# Patient Record
Sex: Female | Born: 1965 | Race: White | Hispanic: No | Smoking: Never smoker
Health system: Southern US, Community
[De-identification: ages and names within clinical notes are randomized; demographics above are authoritative.]

## PROBLEM LIST (undated history)

## (undated) DIAGNOSIS — E785 Hyperlipidemia, unspecified: Secondary | ICD-10-CM

## (undated) DIAGNOSIS — Z803 Family history of malignant neoplasm of breast: Secondary | ICD-10-CM

## (undated) DIAGNOSIS — N879 Dysplasia of cervix uteri, unspecified: Secondary | ICD-10-CM

## (undated) DIAGNOSIS — J329 Chronic sinusitis, unspecified: Secondary | ICD-10-CM

## (undated) HISTORY — DX: Family history of malignant neoplasm of breast: Z80.3

## (undated) HISTORY — DX: Hyperlipidemia, unspecified: E78.5

## (undated) HISTORY — PX: CERVIX LESION DESTRUCTION: SHX591

## (undated) HISTORY — DX: Chronic sinusitis, unspecified: J32.9

## (undated) HISTORY — DX: Dysplasia of cervix uteri, unspecified: N87.9

---
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
## 1999-11-07 HISTORY — PX: AUGMENTATION MAMMAPLASTY: SUR837

## 1999-11-07 HISTORY — PX: BREAST SURGERY: SHX581

## 2014-07-02 VITALS — BP 108/72 | HR 61 | Temp 97.9°F | Resp 16 | Ht 63.75 in | Wt 103.5 lb

## 2014-07-02 DIAGNOSIS — Z8249 Family history of ischemic heart disease and other diseases of the circulatory system: Secondary | ICD-10-CM

## 2014-07-02 DIAGNOSIS — R634 Abnormal weight loss: Secondary | ICD-10-CM

## 2014-07-02 DIAGNOSIS — R636 Underweight: Secondary | ICD-10-CM

## 2014-07-02 DIAGNOSIS — E876 Hypokalemia: Secondary | ICD-10-CM

## 2014-07-02 DIAGNOSIS — Z1159 Encounter for screening for other viral diseases: Secondary | ICD-10-CM

## 2014-07-02 DIAGNOSIS — Z13220 Encounter for screening for lipoid disorders: Secondary | ICD-10-CM

## 2014-07-02 DIAGNOSIS — R630 Anorexia: Secondary | ICD-10-CM

## 2014-07-03 DIAGNOSIS — E876 Hypokalemia: Secondary | ICD-10-CM

## 2014-07-05 DIAGNOSIS — E876 Hypokalemia: Secondary | ICD-10-CM | POA: Insufficient documentation

## 2014-07-05 DIAGNOSIS — Z8249 Family history of ischemic heart disease and other diseases of the circulatory system: Secondary | ICD-10-CM | POA: Insufficient documentation

## 2014-07-05 DIAGNOSIS — Z13220 Encounter for screening for lipoid disorders: Secondary | ICD-10-CM | POA: Insufficient documentation

## 2014-07-05 DIAGNOSIS — R636 Underweight: Secondary | ICD-10-CM | POA: Insufficient documentation

## 2014-07-05 DIAGNOSIS — R634 Abnormal weight loss: Secondary | ICD-10-CM | POA: Insufficient documentation

## 2015-06-07 DIAGNOSIS — B353 Tinea pedis: Secondary | ICD-10-CM

## 2015-07-07 VITALS — BP 96/70 | HR 69 | Temp 97.9°F | Resp 12 | Ht 63.0 in | Wt 102.0 lb

## 2015-07-07 DIAGNOSIS — R634 Abnormal weight loss: Secondary | ICD-10-CM | POA: Diagnosis not present

## 2015-07-07 DIAGNOSIS — E785 Hyperlipidemia, unspecified: Secondary | ICD-10-CM | POA: Diagnosis not present

## 2015-07-07 DIAGNOSIS — E876 Hypokalemia: Secondary | ICD-10-CM

## 2015-07-07 DIAGNOSIS — E559 Vitamin D deficiency, unspecified: Secondary | ICD-10-CM | POA: Diagnosis not present

## 2015-07-07 DIAGNOSIS — R636 Underweight: Secondary | ICD-10-CM

## 2015-07-07 DIAGNOSIS — R599 Enlarged lymph nodes, unspecified: Secondary | ICD-10-CM | POA: Diagnosis not present

## 2015-07-07 DIAGNOSIS — R591 Generalized enlarged lymph nodes: Secondary | ICD-10-CM

## 2015-07-09 DIAGNOSIS — E876 Hypokalemia: Secondary | ICD-10-CM

## 2015-07-10 DIAGNOSIS — R591 Generalized enlarged lymph nodes: Secondary | ICD-10-CM | POA: Insufficient documentation

## 2015-07-26 DIAGNOSIS — B353 Tinea pedis: Secondary | ICD-10-CM

## 2015-08-12 VITALS — BP 98/60 | HR 62 | Temp 98.2°F | Resp 14 | Ht 63.0 in | Wt 107.4 lb

## 2015-08-12 DIAGNOSIS — B029 Zoster without complications: Secondary | ICD-10-CM | POA: Diagnosis not present

## 2015-08-16 VITALS — BP 102/78 | HR 60 | Temp 98.2°F | Resp 16 | Ht 63.0 in | Wt 106.6 lb

## 2015-08-16 DIAGNOSIS — L0100 Impetigo, unspecified: Secondary | ICD-10-CM | POA: Diagnosis not present

## 2015-08-16 MED ADMIN — Methylprednisolone Acetate Inj Susp 40 MG/ML: 40 mg | INTRAMUSCULAR | NDC 00009307301

## 2015-08-18 DIAGNOSIS — L0100 Impetigo, unspecified: Secondary | ICD-10-CM | POA: Insufficient documentation

## 2015-08-24 VITALS — BP 110/78 | HR 59 | Temp 98.0°F | Resp 16 | Ht 63.0 in | Wt 103.4 lb

## 2015-08-24 DIAGNOSIS — L0100 Impetigo, unspecified: Secondary | ICD-10-CM | POA: Diagnosis not present

## 2015-08-24 DIAGNOSIS — B029 Zoster without complications: Secondary | ICD-10-CM

## 2015-09-03 DIAGNOSIS — B373 Candidiasis of vulva and vagina: Secondary | ICD-10-CM | POA: Insufficient documentation

## 2015-09-03 DIAGNOSIS — B3731 Acute candidiasis of vulva and vagina: Secondary | ICD-10-CM | POA: Insufficient documentation

## 2016-03-25 MED ORDER — PREDNISONE 10 MG PO TABS
ORAL_TABLET | ORAL | Status: DC
Start: 2016-03-25 — End: 2016-11-23

## 2016-04-04 DIAGNOSIS — E876 Hypokalemia: Secondary | ICD-10-CM

## 2016-04-10 DIAGNOSIS — E876 Hypokalemia: Secondary | ICD-10-CM | POA: Diagnosis not present

## 2017-01-18 VITALS — BP 100/80 | HR 66 | Wt 106.0 lb

## 2017-01-18 DIAGNOSIS — Z01419 Encounter for gynecological examination (general) (routine) without abnormal findings: Secondary | ICD-10-CM

## 2017-12-11 DIAGNOSIS — Z13220 Encounter for screening for lipoid disorders: Secondary | ICD-10-CM

## 2017-12-11 DIAGNOSIS — R5383 Other fatigue: Secondary | ICD-10-CM

## 2017-12-31 VITALS — BP 98/68 | HR 59 | Temp 99.2°F | Ht 63.0 in | Wt 109.8 lb

## 2017-12-31 DIAGNOSIS — J322 Chronic ethmoidal sinusitis: Secondary | ICD-10-CM

## 2017-12-31 DIAGNOSIS — R51 Headache: Secondary | ICD-10-CM | POA: Diagnosis not present

## 2017-12-31 DIAGNOSIS — R519 Headache, unspecified: Secondary | ICD-10-CM

## 2018-01-07 DIAGNOSIS — J329 Chronic sinusitis, unspecified: Secondary | ICD-10-CM

## 2018-01-07 DIAGNOSIS — H9209 Otalgia, unspecified ear: Secondary | ICD-10-CM

## 2018-01-14 DIAGNOSIS — R634 Abnormal weight loss: Secondary | ICD-10-CM

## 2018-01-14 DIAGNOSIS — E876 Hypokalemia: Secondary | ICD-10-CM

## 2018-01-14 DIAGNOSIS — Z13220 Encounter for screening for lipoid disorders: Secondary | ICD-10-CM

## 2018-01-23 VITALS — BP 106/74 | HR 63 | Ht 63.0 in | Wt 108.0 lb

## 2018-01-23 DIAGNOSIS — Z1151 Encounter for screening for human papillomavirus (HPV): Secondary | ICD-10-CM

## 2018-01-23 DIAGNOSIS — Z1231 Encounter for screening mammogram for malignant neoplasm of breast: Secondary | ICD-10-CM

## 2018-01-23 DIAGNOSIS — Z803 Family history of malignant neoplasm of breast: Secondary | ICD-10-CM

## 2018-01-23 DIAGNOSIS — Z01419 Encounter for gynecological examination (general) (routine) without abnormal findings: Secondary | ICD-10-CM | POA: Diagnosis not present

## 2018-01-23 DIAGNOSIS — Z1239 Encounter for other screening for malignant neoplasm of breast: Secondary | ICD-10-CM

## 2018-01-23 DIAGNOSIS — Z124 Encounter for screening for malignant neoplasm of cervix: Secondary | ICD-10-CM

## 2020-02-05 DIAGNOSIS — E785 Hyperlipidemia, unspecified: Secondary | ICD-10-CM

## 2020-02-05 DIAGNOSIS — E876 Hypokalemia: Secondary | ICD-10-CM | POA: Diagnosis not present

## 2020-02-05 DIAGNOSIS — Z1231 Encounter for screening mammogram for malignant neoplasm of breast: Secondary | ICD-10-CM

## 2020-02-05 DIAGNOSIS — Z9882 Breast implant status: Secondary | ICD-10-CM

## 2020-02-05 DIAGNOSIS — R634 Abnormal weight loss: Secondary | ICD-10-CM

## 2020-02-05 DIAGNOSIS — R7401 Elevation of levels of liver transaminase levels: Secondary | ICD-10-CM

## 2020-02-05 DIAGNOSIS — D72819 Decreased white blood cell count, unspecified: Secondary | ICD-10-CM | POA: Diagnosis not present

## 2020-02-05 DIAGNOSIS — L0100 Impetigo, unspecified: Secondary | ICD-10-CM

## 2020-02-05 DIAGNOSIS — Z13220 Encounter for screening for lipoid disorders: Secondary | ICD-10-CM

## 2020-02-07 DIAGNOSIS — Z9882 Breast implant status: Secondary | ICD-10-CM | POA: Insufficient documentation

## 2020-02-13 DIAGNOSIS — Z1231 Encounter for screening mammogram for malignant neoplasm of breast: Secondary | ICD-10-CM | POA: Diagnosis not present

## 2020-02-16 DIAGNOSIS — N6489 Other specified disorders of breast: Secondary | ICD-10-CM

## 2020-02-16 DIAGNOSIS — R928 Other abnormal and inconclusive findings on diagnostic imaging of breast: Secondary | ICD-10-CM

## 2020-02-27 DIAGNOSIS — R928 Other abnormal and inconclusive findings on diagnostic imaging of breast: Secondary | ICD-10-CM

## 2020-02-27 DIAGNOSIS — N6489 Other specified disorders of breast: Secondary | ICD-10-CM

## 2020-03-12 DIAGNOSIS — Z1231 Encounter for screening mammogram for malignant neoplasm of breast: Secondary | ICD-10-CM | POA: Diagnosis not present

## 2020-03-12 DIAGNOSIS — Z01419 Encounter for gynecological examination (general) (routine) without abnormal findings: Secondary | ICD-10-CM

## 2021-01-10 DIAGNOSIS — Z114 Encounter for screening for human immunodeficiency virus [HIV]: Secondary | ICD-10-CM

## 2021-01-10 DIAGNOSIS — E785 Hyperlipidemia, unspecified: Secondary | ICD-10-CM

## 2021-01-10 DIAGNOSIS — E876 Hypokalemia: Secondary | ICD-10-CM

## 2021-01-10 DIAGNOSIS — E559 Vitamin D deficiency, unspecified: Secondary | ICD-10-CM

## 2021-01-10 DIAGNOSIS — R634 Abnormal weight loss: Secondary | ICD-10-CM

## 2021-01-31 DIAGNOSIS — R634 Abnormal weight loss: Secondary | ICD-10-CM

## 2021-01-31 DIAGNOSIS — Z114 Encounter for screening for human immunodeficiency virus [HIV]: Secondary | ICD-10-CM | POA: Diagnosis not present

## 2021-01-31 DIAGNOSIS — E559 Vitamin D deficiency, unspecified: Secondary | ICD-10-CM | POA: Diagnosis not present

## 2021-01-31 DIAGNOSIS — E785 Hyperlipidemia, unspecified: Secondary | ICD-10-CM

## 2021-01-31 DIAGNOSIS — E876 Hypokalemia: Secondary | ICD-10-CM | POA: Diagnosis not present

## 2021-02-02 DIAGNOSIS — Z8616 Personal history of COVID-19: Secondary | ICD-10-CM

## 2021-02-02 DIAGNOSIS — Z1231 Encounter for screening mammogram for malignant neoplasm of breast: Secondary | ICD-10-CM | POA: Diagnosis not present

## 2021-02-02 DIAGNOSIS — Z0000 Encounter for general adult medical examination without abnormal findings: Secondary | ICD-10-CM | POA: Diagnosis not present

## 2021-02-05 DIAGNOSIS — Z0001 Encounter for general adult medical examination with abnormal findings: Secondary | ICD-10-CM | POA: Insufficient documentation

## 2021-02-05 DIAGNOSIS — Z0000 Encounter for general adult medical examination without abnormal findings: Secondary | ICD-10-CM | POA: Insufficient documentation

## 2021-03-03 DIAGNOSIS — Z1231 Encounter for screening mammogram for malignant neoplasm of breast: Secondary | ICD-10-CM | POA: Insufficient documentation

## 2021-03-28 DIAGNOSIS — Z01419 Encounter for gynecological examination (general) (routine) without abnormal findings: Secondary | ICD-10-CM | POA: Diagnosis not present

## 2021-03-28 DIAGNOSIS — Z1231 Encounter for screening mammogram for malignant neoplasm of breast: Secondary | ICD-10-CM | POA: Diagnosis not present

## 2022-03-06 DIAGNOSIS — D2271 Melanocytic nevi of right lower limb, including hip: Secondary | ICD-10-CM | POA: Diagnosis not present

## 2022-03-06 DIAGNOSIS — D225 Melanocytic nevi of trunk: Secondary | ICD-10-CM | POA: Diagnosis not present

## 2022-03-06 DIAGNOSIS — L570 Actinic keratosis: Secondary | ICD-10-CM | POA: Diagnosis not present

## 2022-03-06 DIAGNOSIS — D2261 Melanocytic nevi of right upper limb, including shoulder: Secondary | ICD-10-CM | POA: Diagnosis not present

## 2022-03-06 DIAGNOSIS — B078 Other viral warts: Secondary | ICD-10-CM | POA: Diagnosis not present

## 2022-03-06 DIAGNOSIS — D2272 Melanocytic nevi of left lower limb, including hip: Secondary | ICD-10-CM | POA: Diagnosis not present

## 2022-03-06 DIAGNOSIS — D2262 Melanocytic nevi of left upper limb, including shoulder: Secondary | ICD-10-CM | POA: Diagnosis not present

## 2022-03-06 DIAGNOSIS — R208 Other disturbances of skin sensation: Secondary | ICD-10-CM | POA: Diagnosis not present

## 2022-03-06 DIAGNOSIS — L814 Other melanin hyperpigmentation: Secondary | ICD-10-CM | POA: Diagnosis not present

## 2022-03-06 DIAGNOSIS — X32XXXA Exposure to sunlight, initial encounter: Secondary | ICD-10-CM | POA: Diagnosis not present

## 2022-03-07 DIAGNOSIS — Z1231 Encounter for screening mammogram for malignant neoplasm of breast: Secondary | ICD-10-CM

## 2022-03-07 DIAGNOSIS — R5383 Other fatigue: Secondary | ICD-10-CM

## 2022-03-07 DIAGNOSIS — E785 Hyperlipidemia, unspecified: Secondary | ICD-10-CM

## 2022-03-07 DIAGNOSIS — R7301 Impaired fasting glucose: Secondary | ICD-10-CM

## 2022-03-07 DIAGNOSIS — Z0000 Encounter for general adult medical examination without abnormal findings: Secondary | ICD-10-CM

## 2022-03-13 DIAGNOSIS — R7301 Impaired fasting glucose: Secondary | ICD-10-CM

## 2022-03-13 DIAGNOSIS — E785 Hyperlipidemia, unspecified: Secondary | ICD-10-CM

## 2022-03-13 DIAGNOSIS — R5383 Other fatigue: Secondary | ICD-10-CM | POA: Diagnosis not present

## 2022-03-14 DIAGNOSIS — Z803 Family history of malignant neoplasm of breast: Secondary | ICD-10-CM | POA: Diagnosis not present

## 2022-03-14 DIAGNOSIS — Z0000 Encounter for general adult medical examination without abnormal findings: Secondary | ICD-10-CM

## 2022-03-16 DIAGNOSIS — Z1231 Encounter for screening mammogram for malignant neoplasm of breast: Secondary | ICD-10-CM | POA: Insufficient documentation

## 2022-03-28 DIAGNOSIS — Z8481 Family history of carrier of genetic disease: Secondary | ICD-10-CM | POA: Diagnosis not present

## 2022-03-28 DIAGNOSIS — Z803 Family history of malignant neoplasm of breast: Secondary | ICD-10-CM | POA: Diagnosis not present

## 2022-04-27 DIAGNOSIS — Z1379 Encounter for other screening for genetic and chromosomal anomalies: Secondary | ICD-10-CM | POA: Insufficient documentation

## 2022-08-29 DIAGNOSIS — M25562 Pain in left knee: Secondary | ICD-10-CM | POA: Diagnosis not present

## 2022-08-29 DIAGNOSIS — G8929 Other chronic pain: Secondary | ICD-10-CM | POA: Diagnosis not present

## 2022-08-29 DIAGNOSIS — M2012 Hallux valgus (acquired), left foot: Secondary | ICD-10-CM | POA: Diagnosis not present

## 2022-08-29 DIAGNOSIS — M25561 Pain in right knee: Secondary | ICD-10-CM | POA: Diagnosis not present

## 2022-10-18 DIAGNOSIS — L659 Nonscarring hair loss, unspecified: Secondary | ICD-10-CM | POA: Diagnosis not present

## 2022-12-30 IMAGING — MG DIGITAL SCREENING BREAST BILAT IMPLANT W/ TOMO W/ CAD
9 of 15 series · 9 of 35 positions shown · non-contrast
Comparison: Previous exam(s).

CLINICAL DATA: Screening.

EXAM:
DIGITAL SCREENING BILATERAL MAMMOGRAM WITH IMPLANTS, CAD AND
TOMOSYNTHESIS
TECHNIQUE: Bilateral screening digital craniocaudal and mediolateral oblique
mammograms were obtained. Bilateral screening digital breast
tomosynthesis was performed. The images were evaluated with
computer-aided detection. Standard and/or implant displaced views
were performed.

[R CC]
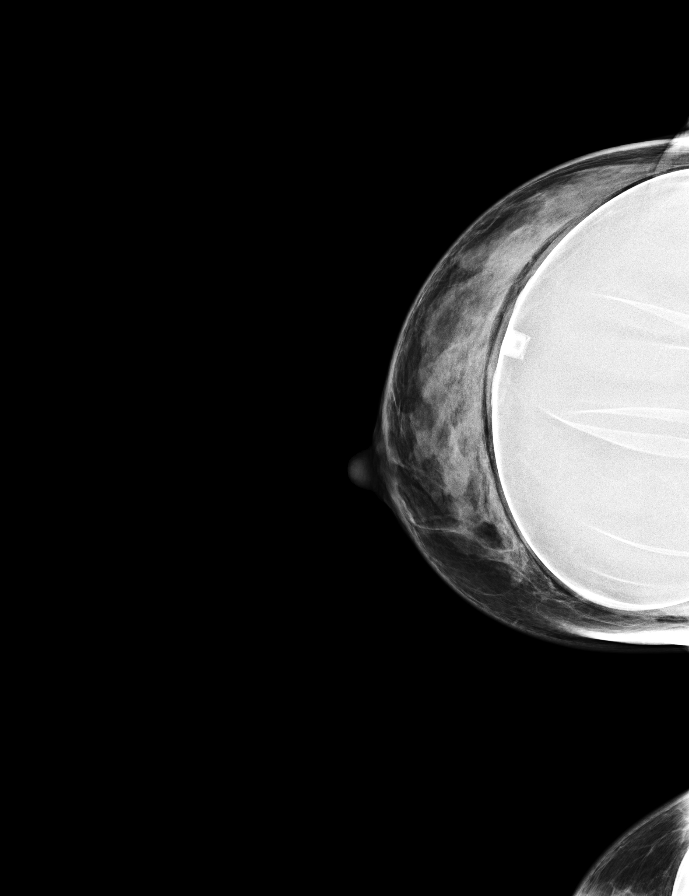

[L CC]
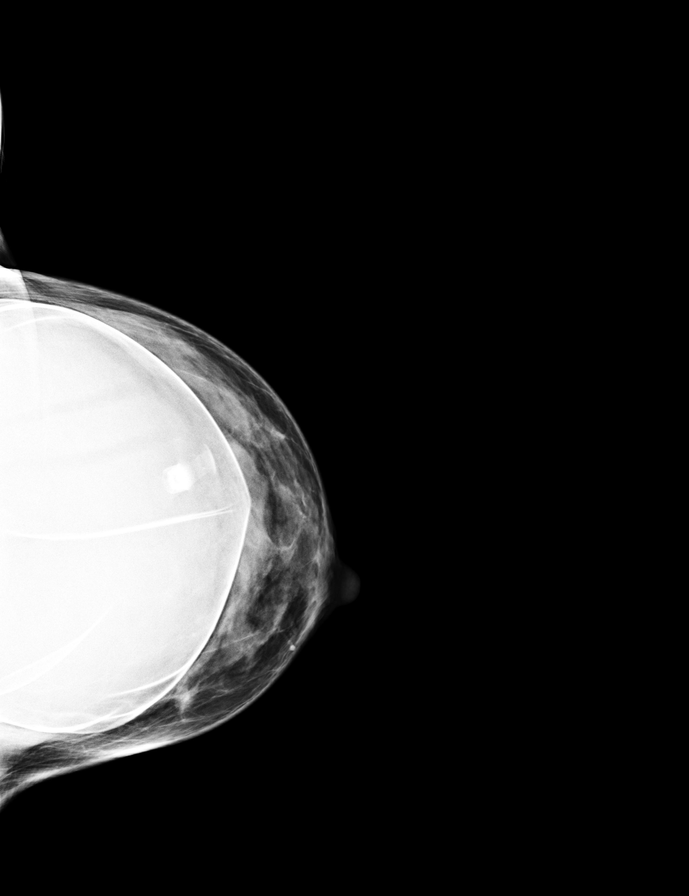

[L MLO]
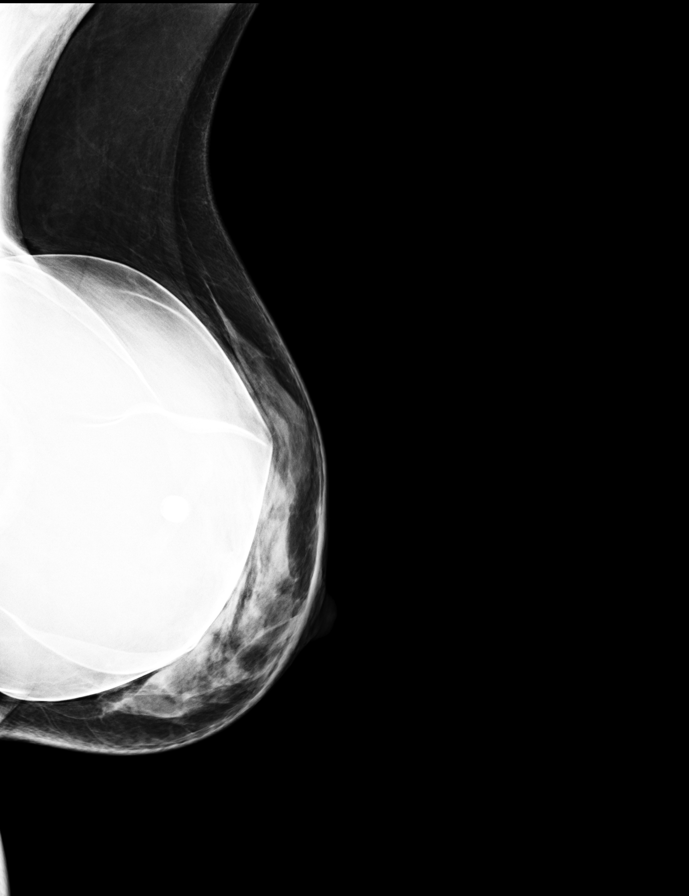

[R MLO (1 of 2)]
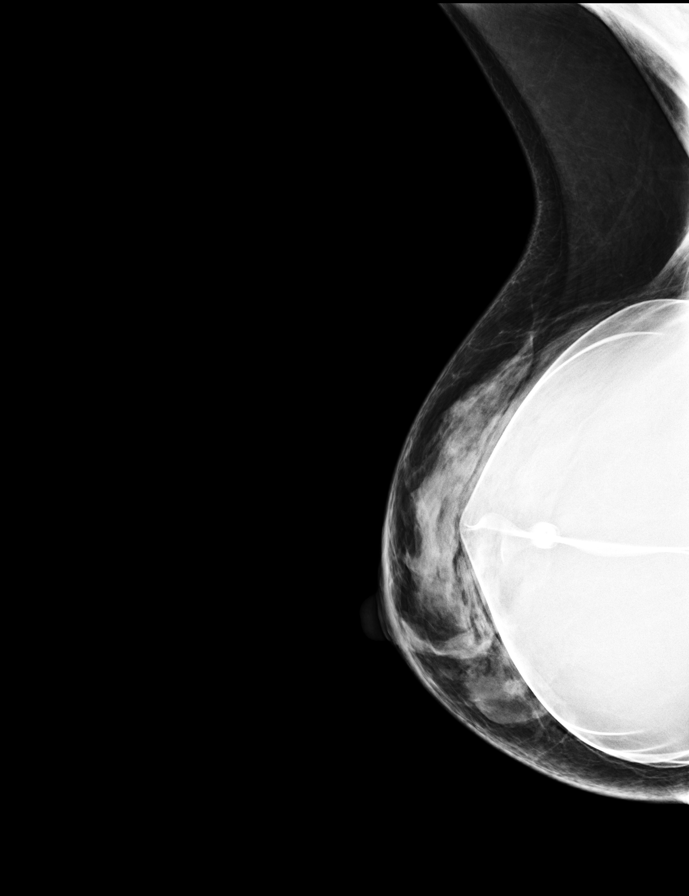

[R MLO (2 of 2)]
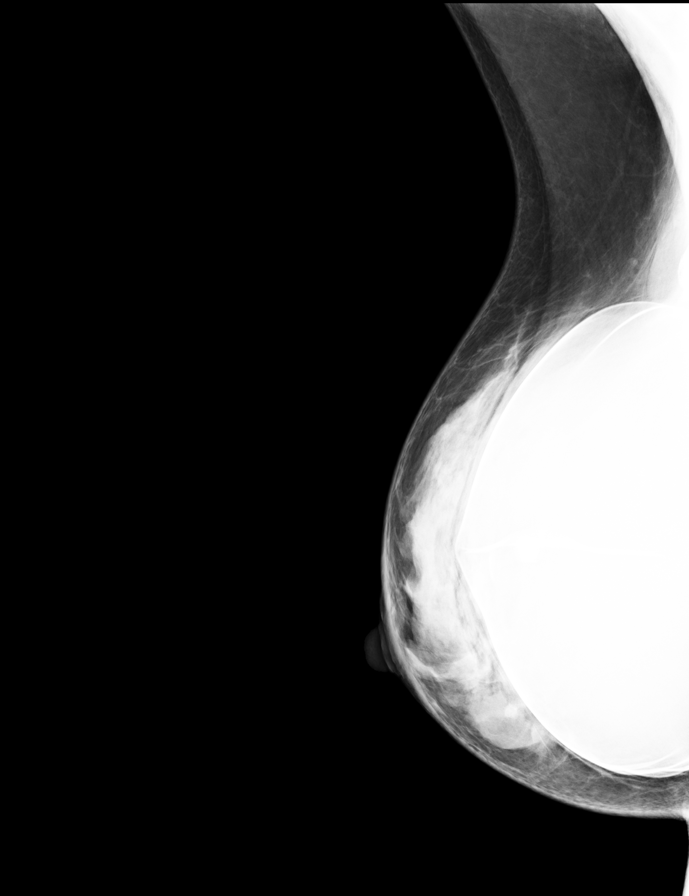

[L MLO synth-2D (1 of 2)]
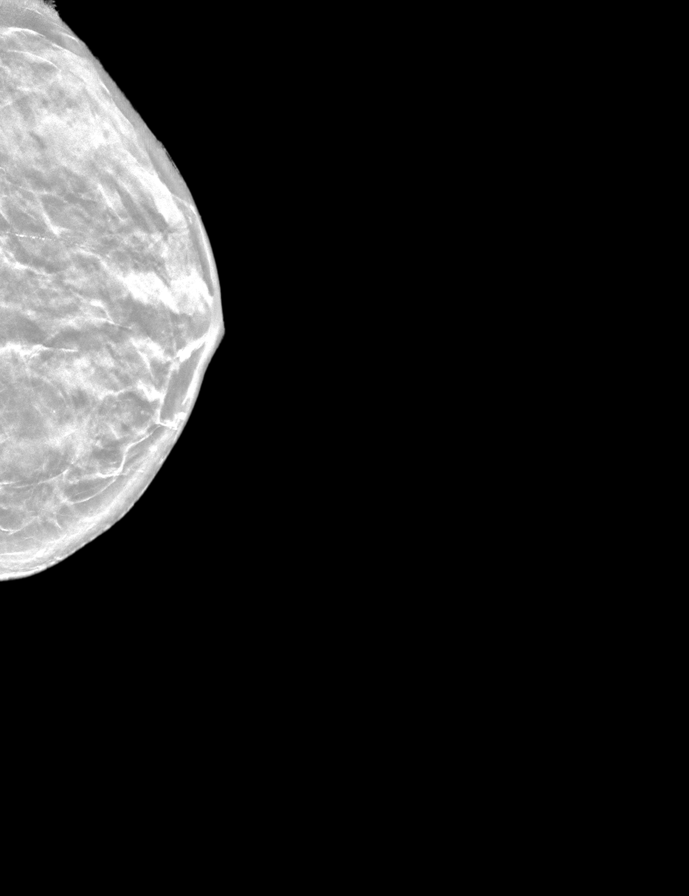

[L CC synth-2D]
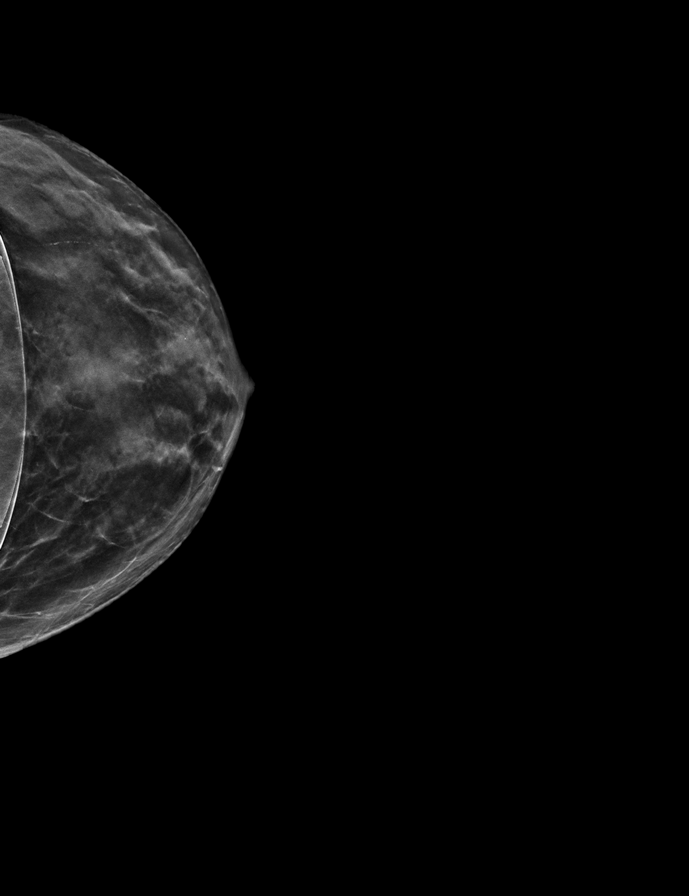

[R CC synth-2D]
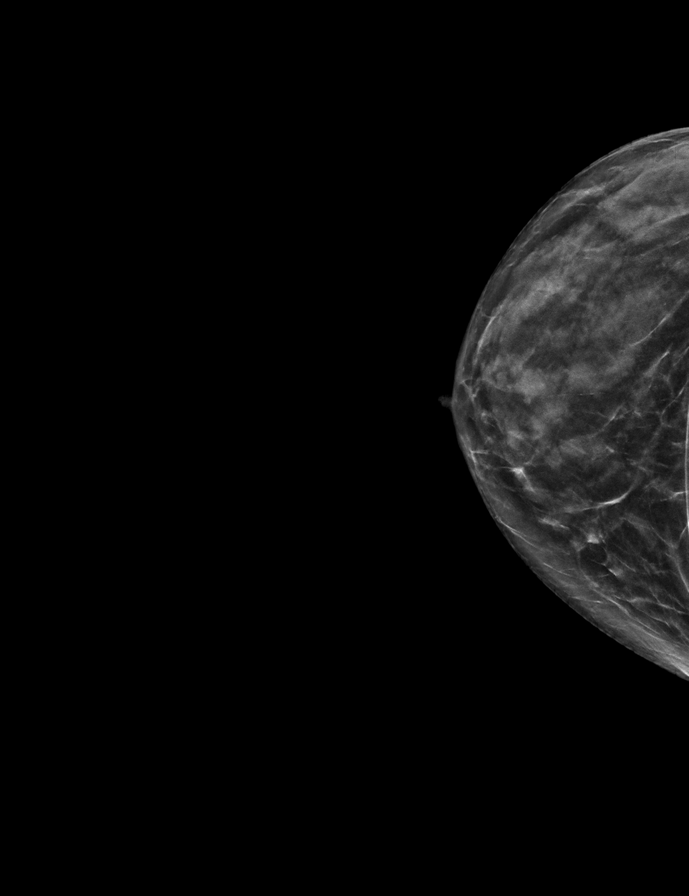

[L MLO synth-2D (2 of 2)]
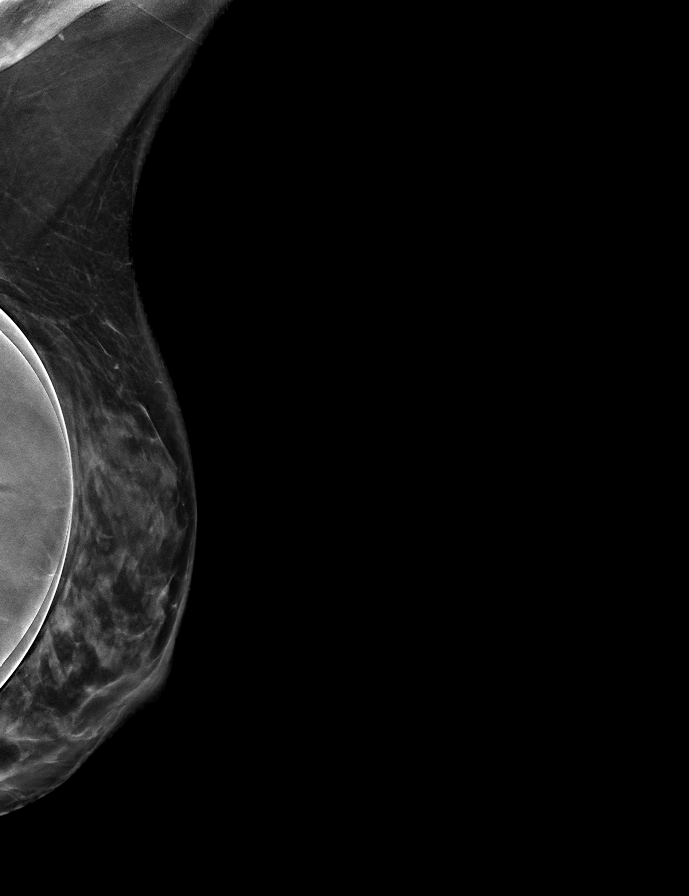

[9 of 35 positions shown; findings below may reference images not displayed]

ACR Breast Density Category d: The breast tissue is extremely dense,
which lowers the sensitivity of mammography.
FINDINGS: The patient has retropectoral implants. There are no findings
suspicious for malignancy.
IMPRESSION: No mammographic evidence of malignancy. A result letter of this
screening mammogram will be mailed directly to the patient.

RECOMMENDATION:
Screening mammogram in one year. (Code:GG-[id number])

BI-RADS CATEGORY  1:  Negative.

## 2023-01-08 DIAGNOSIS — Z1231 Encounter for screening mammogram for malignant neoplasm of breast: Secondary | ICD-10-CM

## 2023-01-16 DIAGNOSIS — M170 Bilateral primary osteoarthritis of knee: Secondary | ICD-10-CM | POA: Diagnosis not present

## 2023-01-17 DIAGNOSIS — Z681 Body mass index (BMI) 19 or less, adult: Secondary | ICD-10-CM | POA: Diagnosis not present

## 2023-01-17 DIAGNOSIS — H9201 Otalgia, right ear: Secondary | ICD-10-CM | POA: Diagnosis not present

## 2023-01-23 DIAGNOSIS — M170 Bilateral primary osteoarthritis of knee: Secondary | ICD-10-CM | POA: Diagnosis not present

## 2023-01-30 DIAGNOSIS — M170 Bilateral primary osteoarthritis of knee: Secondary | ICD-10-CM | POA: Diagnosis not present

## 2023-03-08 DIAGNOSIS — R5383 Other fatigue: Secondary | ICD-10-CM

## 2023-03-08 DIAGNOSIS — R7301 Impaired fasting glucose: Secondary | ICD-10-CM

## 2023-03-08 DIAGNOSIS — E785 Hyperlipidemia, unspecified: Secondary | ICD-10-CM

## 2023-03-20 DIAGNOSIS — Z1231 Encounter for screening mammogram for malignant neoplasm of breast: Secondary | ICD-10-CM | POA: Diagnosis not present

## 2023-04-24 DIAGNOSIS — R5383 Other fatigue: Secondary | ICD-10-CM

## 2023-04-24 DIAGNOSIS — E785 Hyperlipidemia, unspecified: Secondary | ICD-10-CM | POA: Diagnosis not present

## 2023-04-24 DIAGNOSIS — R7301 Impaired fasting glucose: Secondary | ICD-10-CM | POA: Diagnosis not present

## 2023-04-30 DIAGNOSIS — Z0000 Encounter for general adult medical examination without abnormal findings: Secondary | ICD-10-CM | POA: Diagnosis not present

## 2023-04-30 DIAGNOSIS — Z803 Family history of malignant neoplasm of breast: Secondary | ICD-10-CM | POA: Diagnosis not present

## 2023-04-30 DIAGNOSIS — Z0001 Encounter for general adult medical examination with abnormal findings: Secondary | ICD-10-CM

## 2023-04-30 DIAGNOSIS — Z124 Encounter for screening for malignant neoplasm of cervix: Secondary | ICD-10-CM | POA: Diagnosis not present

## 2023-04-30 DIAGNOSIS — M174 Other bilateral secondary osteoarthritis of knee: Secondary | ICD-10-CM

## 2023-05-01 DIAGNOSIS — M179 Osteoarthritis of knee, unspecified: Secondary | ICD-10-CM | POA: Insufficient documentation

## 2023-05-09 DIAGNOSIS — D2261 Melanocytic nevi of right upper limb, including shoulder: Secondary | ICD-10-CM | POA: Diagnosis not present

## 2023-05-09 DIAGNOSIS — D225 Melanocytic nevi of trunk: Secondary | ICD-10-CM | POA: Diagnosis not present

## 2023-05-09 DIAGNOSIS — D2272 Melanocytic nevi of left lower limb, including hip: Secondary | ICD-10-CM | POA: Diagnosis not present

## 2023-05-09 DIAGNOSIS — D2262 Melanocytic nevi of left upper limb, including shoulder: Secondary | ICD-10-CM | POA: Diagnosis not present

## 2023-05-09 DIAGNOSIS — L570 Actinic keratosis: Secondary | ICD-10-CM | POA: Diagnosis not present

## 2023-06-26 DIAGNOSIS — M79671 Pain in right foot: Secondary | ICD-10-CM | POA: Diagnosis not present

## 2023-06-26 DIAGNOSIS — S9781XA Crushing injury of right foot, initial encounter: Secondary | ICD-10-CM | POA: Diagnosis not present

## 2023-06-26 DIAGNOSIS — M7989 Other specified soft tissue disorders: Secondary | ICD-10-CM | POA: Diagnosis not present

## 2023-08-07 DIAGNOSIS — M170 Bilateral primary osteoarthritis of knee: Secondary | ICD-10-CM | POA: Diagnosis not present

## 2023-10-02 DIAGNOSIS — R1031 Right lower quadrant pain: Secondary | ICD-10-CM | POA: Insufficient documentation
